# Patient Record
Sex: Male | Born: 1995 | Race: Black or African American | Hispanic: No | Marital: Single | Smoking: Never smoker
Health system: Southern US, Community
[De-identification: ages and names within clinical notes are randomized; demographics above are authoritative.]

---
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
## 2015-10-13 ENCOUNTER — Emergency Department (HOSPITAL_BASED_OUTPATIENT_CLINIC_OR_DEPARTMENT_OTHER)
Admission: EM | Admit: 2015-10-13 | Discharge: 2015-10-14 | Disposition: A | Discharge status: Home / Self Care | Payer: BLUE CROSS/BLUE SHIELD | Attending: Emergency Medicine | Admitting: Emergency Medicine

## 2015-10-13 ENCOUNTER — Encounter (HOSPITAL_BASED_OUTPATIENT_CLINIC_OR_DEPARTMENT_OTHER): Payer: Self-pay | Admitting: *Deleted

## 2015-10-13 DIAGNOSIS — J029 Acute pharyngitis, unspecified: Secondary | ICD-10-CM | POA: Diagnosis present

## 2015-10-13 DIAGNOSIS — J039 Acute tonsillitis, unspecified: Secondary | ICD-10-CM

## 2015-10-13 LAB — RAPID STREP SCREEN (MED CTR MEBANE ONLY): Streptococcus, Group A Screen (Direct): NEGATIVE

## 2015-10-13 NOTE — ED Provider Notes (Signed)
[facility] Provider Note   CSN: [id number] Arrival date & time: 10/13/15  2150 By signing my name below, I, Bridgette HabermannMaria Tan, attest that this documentation has been prepared under the direction and in the presence of Shon Batonourtney F Kalem Rockwell, MD. Electronically Signed: Bridgette HabermannMaria Tan, ED Scribe. 10/13/15. 11:45 PM.  History   Chief Complaint Chief Complaint  Patient presents with  . Sore Throat   HPI Comments: Christopher Wong is a 20 y.o. male who presents to the Emergency Department complaining of sore throat onset 4 days ago with associated nausea that is exacerbated with standing. Pt rates his current pain a 6/10. Pt also endorses one episode of hemoptysis earlier today.Denies shortness of breath. States that he felt like he had something in the back of his throat. Describes small globs of blood. He has taken Aleve and Advil with mild relief. He denies h/o similar symptoms. Pt denies fever, congestion, rhinorrhea, or any other associated symptoms.  The history is provided by the patient. No language interpreter was used.    History reviewed. No pertinent past medical history.  There are no active problems to display for this patient.   History reviewed. No pertinent surgical history.     Home Medications    Prior to Admission medications   Medication Sig Start Date End Date Taking? Authorizing Provider  clindamycin (CLEOCIN) 150 MG capsule Take 2 capsules (300 mg total) by mouth 3 (three) times daily. 10/14/15   Shon Batonourtney F Maya Scholer, MD    Family History No family history on file.  Social History Social History  Substance Use Topics  . Smoking status: Never Smoker  . Smokeless tobacco: Never Used  . Alcohol use No     Allergies   Penicillins   Review of Systems Review of Systems  Constitutional: Negative for fever.  HENT: Positive for sore throat. Negative for congestion and rhinorrhea.   Respiratory: Positive for cough. Negative for shortness of breath.     Cardiovascular: Negative for chest pain.  All other systems reviewed and are negative.  Physical Exam Updated Vital Signs BP 123/79   Pulse 92   Temp 99.1 F (37.3 C) (Oral)   Resp 18   Ht 6\' 1"  (1.854 m)   Wt 180 lb (81.6 kg)   SpO2 100%   BMI 23.75 kg/m   Physical Exam  Constitutional: He is oriented to person, place, and time. He appears well-developed and well-nourished.  HENT:  Head: Normocephalic and atraumatic.  Mouth/Throat: Oropharyngeal exudate present.  Mild posterior oropharyngeal edema, ulcerations of bilateral tonsils with mild swelling, no active bleeding, tonsillar exudate noted, uvula midline, no trismus, normal phonation  Neck: Neck supple.  Cardiovascular: Normal rate, regular rhythm and normal heart sounds.   No murmur heard. Pulmonary/Chest: Effort normal and breath sounds normal. No respiratory distress. He has no wheezes.  Abdominal: Soft. There is no tenderness.  Musculoskeletal: He exhibits no edema.  Lymphadenopathy:    He has cervical adenopathy.  Neurological: He is alert and oriented to person, place, and time.  Skin: Skin is warm and dry.  Psychiatric: He has a normal mood and affect.  Nursing note and vitals reviewed.    ED Treatments / Results  DIAGNOSTIC STUDIES: Oxygen Saturation is 100% on RA, normal by my interpretation.    COORDINATION OF CARE: 11:44 PM Discussed treatment plan with pt at bedside which includes Clindamycin Rx and pt agreed to plan.  Labs (all labs ordered are listed, but only abnormal results are displayed) Labs  Reviewed  RAPID STREP SCREEN (NOT AT [facility])  CULTURE, GROUP A STREP [facility])    EKG  EKG Interpretation None       Radiology No results found.  Procedures Procedures (including critical care time)  Medications Ordered in ED Medications  predniSONE (DELTASONE) tablet 60 mg (60 mg Oral Given 10/14/15 0014)  clindamycin (CLEOCIN) capsule 300 mg (300 mg Oral Given 10/14/15 0014)     Initial  Impression / Assessment and Plan / ED Course  I have reviewed the triage vital signs and the nursing notes.  Pertinent labs & imaging results that were available during my care of the patient were reviewed by me and considered in my medical decision making (see chart for details).  Clinical Course    Patient presents with sore throat. Reports episode of hemoptysis this evening. Nontoxic. Vital signs reassuring. Afebrile. Strep screen is negative. He appears to have tonsillitis. No active bleeding at this time but given history, suspect patient may have had some bleeding from the ulcerations on his tonsils resulting in the episode of hemoptysis. Denies chest pain, shortness of breath. Patient was given Decadron and one dose of clindamycin. Will discharge with clindamycin in ENT follow-up. No evidence at this time of deep space infection. Patient was given return cautions.  After history, exam, and medical workup I feel the patient has been appropriately medically screened and is safe for discharge home. Pertinent diagnoses were discussed with the patient. Patient was given return precautions.   Final Clinical Impressions(s) / ED Diagnoses   Final diagnoses:  Tonsillitis with exudate  Tonsillitis   I personally performed the services described in this documentation, which was scribed in my presence. The recorded information has been reviewed and is accurate.   New Prescriptions New Prescriptions   CLINDAMYCIN (CLEOCIN) 150 MG CAPSULE    Take 2 capsules (300 mg total) by mouth 3 (three) times daily.     Shon Baton, MD 10/14/15 [phone]

## 2015-10-13 NOTE — ED Notes (Signed)
C/o sorerthroat x 4 days,  No other complaint at present

## 2015-10-14 MED ORDER — PREDNISONE 50 MG PO TABS
60.0000 mg | ORAL_TABLET | Freq: Once | ORAL | Status: AC
  Administered 2015-10-14: 60 mg via ORAL
  Filled 2015-10-14: qty 1

## 2015-10-14 MED ORDER — CLINDAMYCIN HCL 150 MG PO CAPS: 300.0000 mg | ORAL_CAPSULE | Freq: Three times a day (TID) | ORAL | 0 refills | Status: AC

## 2015-10-14 MED ORDER — CLINDAMYCIN HCL 150 MG PO CAPS
300.0000 mg | ORAL_CAPSULE | Freq: Once | ORAL | Status: AC
  Administered 2015-10-14: 300 mg via ORAL
  Filled 2015-10-14: qty 2

## 2015-10-15 LAB — CULTURE, GROUP A STREP (THRC)

## 2015-10-16 ENCOUNTER — Telehealth (HOSPITAL_COMMUNITY): Payer: Self-pay

## 2015-10-16 NOTE — Telephone Encounter (Signed)
Post ED Visit - Positive Culture Follow-up  Culture report reviewed by antimicrobial stewardship pharmacist:  []  Enzo BiNathan Batchelder, Pharm.D. []  Celedonio MiyamotoJeremy Frens, Pharm.D., BCPS []  Garvin FilaMike Maccia, Pharm.D. []  Georgina PillionElizabeth Martin, Pharm.D., BCPS []  McMullenMinh Pham, [address]Pharm[address]D., BCPS, AAHIVP []  Estella HuskMichelle Turner, Pharm.D., BCPS, AAHIVP []  Tennis Mustassie Stewart, Pharm.D. []  Sherle Poeob Vincent, [address]Ph[address] Gertie GowdaX  Emily Stewart, Pharm.D.  Positive throat culture, Group A Strep Treated with Clindamycin, organism sensitive to the same and no further patient follow-up is required at this time.  Arvid RightClark, Ameer Sanden Dorn 10/16/2015, 9:46 AM
